# Patient Record
Sex: Male | Born: 1980
Health system: Southern US, Community
[De-identification: ages and names within clinical notes are randomized; demographics above are authoritative.]

## PROBLEM LIST (undated history)

## (undated) ENCOUNTER — Emergency Department (HOSPITAL_COMMUNITY): Admission: EM | Payer: Medicaid Other | Source: Home / Self Care

## (undated) DIAGNOSIS — A4902 Methicillin resistant Staphylococcus aureus infection, unspecified site: Secondary | ICD-10-CM

## (undated) HISTORY — PX: OTHER SURGICAL HISTORY: SHX169

---

## 2001-12-12 ENCOUNTER — Emergency Department (HOSPITAL_COMMUNITY): Admission: EM | Admit: 2001-12-12 | Discharge: 2001-12-12 | Payer: Self-pay | Admitting: Emergency Medicine

## 2019-09-13 ENCOUNTER — Emergency Department (HOSPITAL_COMMUNITY)
Admission: EM | Admit: 2019-09-13 | Discharge: 2019-09-14 | Disposition: A | Discharge status: Home / Self Care | Payer: Medicaid Other | Attending: Emergency Medicine | Admitting: Emergency Medicine

## 2019-09-13 DIAGNOSIS — M545 Low back pain, unspecified: Secondary | ICD-10-CM

## 2019-09-13 DIAGNOSIS — F17210 Nicotine dependence, cigarettes, uncomplicated: Secondary | ICD-10-CM | POA: Insufficient documentation

## 2019-09-13 DIAGNOSIS — M549 Dorsalgia, unspecified: Secondary | ICD-10-CM | POA: Diagnosis present

## 2019-09-13 DIAGNOSIS — G8929 Other chronic pain: Secondary | ICD-10-CM | POA: Insufficient documentation

## 2019-09-14 ENCOUNTER — Emergency Department (HOSPITAL_COMMUNITY)
Admission: EM | Admit: 2019-09-14 | Discharge: 2019-09-14 | Disposition: A | Discharge status: Home / Self Care | Payer: Medicaid Other | Source: Home / Self Care | Attending: Emergency Medicine | Admitting: Emergency Medicine

## 2019-09-14 ENCOUNTER — Encounter (HOSPITAL_COMMUNITY): Payer: Self-pay

## 2019-09-14 ENCOUNTER — Encounter (HOSPITAL_COMMUNITY): Payer: Self-pay | Admitting: Emergency Medicine

## 2019-09-14 ENCOUNTER — Emergency Department (HOSPITAL_COMMUNITY): Payer: Medicaid Other

## 2019-09-14 ENCOUNTER — Other Ambulatory Visit: Payer: Self-pay

## 2019-09-14 DIAGNOSIS — L03211 Cellulitis of face: Secondary | ICD-10-CM | POA: Insufficient documentation

## 2019-09-14 DIAGNOSIS — F17210 Nicotine dependence, cigarettes, uncomplicated: Secondary | ICD-10-CM | POA: Insufficient documentation

## 2019-09-14 DIAGNOSIS — Z79899 Other long term (current) drug therapy: Secondary | ICD-10-CM | POA: Insufficient documentation

## 2019-09-14 LAB — CBC WITH DIFFERENTIAL/PLATELET
Abs Immature Granulocytes: 0.03 10*3/uL (ref 0.00–0.07)
Basophils Absolute: 0 10*3/uL (ref 0.0–0.1)
Basophils Relative: 0 %
Eosinophils Absolute: 0.3 10*3/uL (ref 0.0–0.5)
Eosinophils Relative: 3 %
HCT: 42.7 % (ref 39.0–52.0)
Hemoglobin: 13 g/dL (ref 13.0–17.0)
Immature Granulocytes: 0 %
Lymphocytes Relative: 10 %
Lymphs Abs: 1.1 10*3/uL (ref 0.7–4.0)
MCH: 25.9 pg — ABNORMAL LOW (ref 26.0–34.0)
MCHC: 30.4 g/dL (ref 30.0–36.0)
MCV: 85.2 fL (ref 80.0–100.0)
Monocytes Absolute: 0.9 10*3/uL (ref 0.1–1.0)
Monocytes Relative: 8 %
Neutro Abs: 8.8 10*3/uL — ABNORMAL HIGH (ref 1.7–7.7)
Neutrophils Relative %: 79 %
Platelets: 299 10*3/uL (ref 150–400)
RBC: 5.01 MIL/uL (ref 4.22–5.81)
RDW: 16.1 % — ABNORMAL HIGH (ref 11.5–15.5)
WBC: 11.1 10*3/uL — ABNORMAL HIGH (ref 4.0–10.5)
nRBC: 0 % (ref 0.0–0.2)

## 2019-09-14 LAB — C-REACTIVE PROTEIN: CRP: 1.4 mg/dL — ABNORMAL HIGH (ref <1.0)

## 2019-09-14 LAB — BASIC METABOLIC PANEL
Anion gap: 8 (ref 5–15)
BUN: 11 mg/dL (ref 6–20)
CO2: 27 mmol/L (ref 22–32)
Calcium: 9.2 mg/dL (ref 8.9–10.3)
Chloride: 102 mmol/L (ref 98–111)
Creatinine, Ser: 1.17 mg/dL (ref 0.61–1.24)
GFR calc Af Amer: >60 mL/min (ref >60)
GFR calc non Af Amer: >60 mL/min (ref >60)
Glucose, Bld: 91 mg/dL (ref 70–99)
Potassium: 3.9 mmol/L (ref 3.5–5.1)
Sodium: 137 mmol/L (ref 135–145)

## 2019-09-14 LAB — SEDIMENTATION RATE: Sed Rate: 12 mm/hr (ref 0–16)

## 2019-09-14 MED ORDER — DOXYCYCLINE HYCLATE 100 MG PO CAPS: 100.0000 mg | ORAL_CAPSULE | Freq: Two times a day (BID) | ORAL | 0 refills | Status: DC

## 2019-09-14 MED ORDER — SODIUM CHLORIDE (PF) 0.9 % IJ SOLN
INTRAMUSCULAR | Status: AC
  Filled 2019-09-14: qty 50

## 2019-09-14 MED ORDER — DOXYCYCLINE HYCLATE 100 MG PO TABS
100.0000 mg | ORAL_TABLET | Freq: Once | ORAL | Status: AC
  Administered 2019-09-14: 100 mg via ORAL
  Filled 2019-09-14: qty 1

## 2019-09-14 MED ORDER — METHOCARBAMOL 500 MG PO TABS: 500.0000 mg | ORAL_TABLET | Freq: Two times a day (BID) | ORAL | 0 refills | Status: AC

## 2019-09-14 MED ORDER — LIDOCAINE 5 % EX PTCH: 1.0000 | MEDICATED_PATCH | CUTANEOUS | 0 refills | Status: AC

## 2019-09-14 MED ORDER — VANCOMYCIN HCL IN DEXTROSE 1-5 GM/200ML-% IV SOLN
1000.0000 mg | Freq: Once | INTRAVENOUS | Status: AC
  Administered 2019-09-14: 1000 mg via INTRAVENOUS
  Filled 2019-09-14: qty 200

## 2019-09-14 MED ORDER — IOHEXOL 300 MG/ML  SOLN
75.0000 mL | Freq: Once | INTRAMUSCULAR | Status: AC | PRN
  Administered 2019-09-14: 75 mL via INTRAVENOUS

## 2019-09-14 MED FILL — DOXYCYCLINE HYC 100 MG CAPS: 100 | 10 days supply | Qty: 20 | Fill #0

## 2019-09-14 NOTE — ED Notes (Signed)
Pt provided soda and sandwich.

## 2019-09-14 NOTE — ED Notes (Signed)
Pt very groggy, unable to tell RN what he took.  Pt rolled over and went back to sleep.

## 2019-09-14 NOTE — Discharge Instructions (Signed)
We saw in the ER for facial swelling. Results revealed that you have facial cellulitis without any abscess.  Please take doxycycline. Follow-up with your infectious disease doctors if you are not getting better.  Return to the ER if you start having severe headaches, fevers, nausea, vomiting, vision change.

## 2019-09-14 NOTE — ED Notes (Signed)
ED Provider at bedside. 

## 2019-09-14 NOTE — Progress Notes (Signed)
TOC CM received referral for medication assistance. Pt has Medicaid which covers his meds for a $3 copay. [place] and they will assist him with copay cost. Attempted to contact pt but no voicemail. [place] [place] [facility] TOC CM [phone]

## 2019-09-14 NOTE — ED Provider Notes (Signed)
[facility] EMERGENCY DEPARTMENT Provider Note   CSN: [id number] Arrival date & time: 09/13/19  2352     History   Chief Complaint Chief Complaint  Patient presents with   Back Pain    HPI Manuel Gibbs is a 38 y.o. male.     HPI   Manuel Gibbs is a 38 y.o. male, with a history of back pain, presenting to the ED with back pain starting afternoon of Oct 11. Pain is left lower back, cramping, intermittent, moderate to severe, nonradiating.  He states he has had this back pain "flareup" before. He has taken Tylenol for his pain.  He states he has never been assessed for this pain. Denies fever/chills, abdominal pain, syncope, numbness, weakness, falls/trauma, urinary symptoms, or any other complaints. States he does have a PCP, but does not remember their name.    History reviewed. No pertinent past medical history.  There are no active problems to display for this patient.   History reviewed. No pertinent surgical history.      Home Medications    Prior to Admission medications   Medication Sig Start Date End Date Taking? Authorizing Provider  doxycycline (VIBRAMYCIN) 100 MG capsule Take 1 capsule (100 mg total) by mouth 2 (two) times daily. 09/14/19   Varney Biles, MD  ibuprofen (ADVIL) 200 MG tablet Take 200 mg by mouth every 6 (six) hours as needed for moderate pain.    [provider]  lidocaine (LIDODERM) 5 % Place 1 patch onto the skin daily. Remove & Discard patch within 12 hours or as directed by MD 09/14/19   Armour Villanueva C, PA-C  methocarbamol (ROBAXIN) 500 MG tablet Take 1 tablet (500 mg total) by mouth 2 (two) times daily. 09/14/19   Maninder Deboer, Helane Gunther, PA-C    Family History No family history on file.  Social History Social History   Tobacco Use   Smoking status: Current Every Day Smoker  Substance Use Topics   Alcohol use: Not on file   Drug use: Not on file     Allergies   Patient has no known  allergies.   Review of Systems Review of Systems  Constitutional: Negative for chills and fever.  Respiratory: Negative for cough and shortness of breath.   Cardiovascular: Negative for chest pain.  Gastrointestinal: Negative for abdominal pain, diarrhea, nausea and vomiting.  Genitourinary: Negative for difficulty urinating, dysuria, flank pain, frequency, hematuria and testicular pain.  Musculoskeletal: Positive for back pain.  Neurological: Negative for dizziness, syncope, weakness, light-headedness and numbness.  All other systems reviewed and are negative.    Physical Exam Updated Vital Signs BP (!) 155/98 (BP Location: Right Arm)    Pulse 81    Temp 97.7 F (36.5 C) (Oral)    Resp 17    SpO2 100%   Physical Exam Vitals signs and nursing note reviewed.  Constitutional:      General: He is not in acute distress.    Appearance: He is well-developed. He is not diaphoretic.     Comments: Patient initially sleeping when I walked in to conduct the interview.  HENT:     Head: Normocephalic and atraumatic.     Mouth/Throat:     Mouth: Mucous membranes are moist.     Pharynx: Oropharynx is clear.  Eyes:     Conjunctiva/sclera: Conjunctivae normal.  Neck:     Musculoskeletal: Neck supple.  Cardiovascular:     Rate and Rhythm: Normal rate and regular rhythm.  Pulses: Normal pulses.          Radial pulses are 2+ on the right side and 2+ on the left side.       Posterior tibial pulses are 2+ on the right side and 2+ on the left side.     Heart sounds: Normal heart sounds.     Comments: Tactile temperature in the extremities appropriate and equal bilaterally. Pulmonary:     Effort: Pulmonary effort is normal. No respiratory distress.     Breath sounds: Normal breath sounds.  Abdominal:     Palpations: Abdomen is soft.     Tenderness: There is no abdominal tenderness. There is no guarding.  Musculoskeletal:       Back:     Right lower leg: No edema.     Left lower leg:  No edema.     Comments: Normal motor function intact in all extremities. No midline spinal tenderness.   Lymphadenopathy:     Cervical: No cervical adenopathy.  Skin:    General: Skin is warm and dry.  Neurological:     Mental Status: He is alert.     Comments: Sensation grossly intact to light touch in the lower extremities bilaterally. No saddle anesthesias. Strength 5/5 in the bilateral lower extremities. No noted gait deficit. Coordination intact with heel to shin testing.  Psychiatric:        Mood and Affect: Mood and affect normal.        Speech: Speech normal.        Behavior: Behavior normal.      ED Treatments / Results  Labs (all labs ordered are listed, but only abnormal results are displayed) Labs Reviewed - No data to display  EKG None  Radiology Ct Maxillofacial W Contrast  Result Date: 09/14/2019 CLINICAL DATA:  Mass, lump or swelling of the chin and around the eyes. History of previous abscess. Evaluate for recurrent abscess. EXAM: CT MAXILLOFACIAL WITH CONTRAST TECHNIQUE: Multidetector CT imaging of the maxillofacial structures was performed with intravenous contrast. Multiplanar CT image reconstructions were also generated. CONTRAST:  47mL OMNIPAQUE IOHEXOL 300 MG/ML  SOLN COMPARISON:  None. FINDINGS: Osseous: Normal. No traumatic finding. No evidence of dental or periodontal disease resulting in acute inflammation. Orbits: Normal postseptal orbits. Nonspecific soft tissue swelling of the periorbital superficial soft tissues which could be cellulitis. No evidence of abscess. Sinuses: Clear and normal. Soft tissues: Also nonspecific edema pattern within the superficial soft tissues of the chin region. No evidence of drainable abscess. No evidence of regional low-density adenopathy. Limited intracranial: Normal IMPRESSION: Superficial edema in the periorbital soft tissues and in the region of the chin consistent with superficial cellulitis. No evidence of drainable  abscess or deep space infection. Electronically Signed   By: Paulina Fusi M.D.   On: 09/14/2019 13:56    Procedures Procedures (including critical care time)  Medications Ordered in ED Medications - No data to display   Initial Impression / Assessment and Plan / ED Course  I have reviewed the triage vital signs and the nursing notes.  Pertinent labs & imaging results that were available during my care of the patient were reviewed by me and considered in my medical decision making (see chart for details).        Patient presents with what he describes as acute on chronic left lower back pain. No focal neuro deficits.  No red flag symptoms.  I discussed with the patient the possibility of alternative diagnoses, such as kidney stone,  however, he was not interested in exploring these.  I advised him to follow-up with his PCP. The patient was given instructions for home care as well as return precautions. Patient voices understanding of these instructions, accepts the plan, and is comfortable with discharge.   Final Clinical Impressions(s) / ED Diagnoses   Final diagnoses:  Chronic left-sided low back pain without sciatica    ED Discharge Orders         Ordered    methocarbamol (ROBAXIN) 500 MG tablet  2 times daily     09/14/19 0520    lidocaine (LIDODERM) 5 %  Every 24 hours     09/14/19 0520           Anselm PancoastJoy, Ramani Riva C, PA-C 09/15/19 0433    Dione BoozeGlick, David, MD 09/15/19 1015

## 2019-09-14 NOTE — Discharge Instructions (Addendum)
°  Take it easy, but do not lay around too much as this may make any stiffness worse.  °Antiinflammatory medications: Take 600 mg of ibuprofen every 6 hours or 440 mg (over the counter dose) to 500 mg (prescription dose) of naproxen every 12 hours for the next 3 days. After this time, these medications may be used as needed for pain. Take these medications with food to avoid upset stomach. Choose only one of these medications, do not take them together. °Acetaminophen (generic for Tylenol): Should you continue to have additional pain while taking the ibuprofen or naproxen, you may add in acetaminophen as needed. Your daily total maximum amount of acetaminophen from all sources should be limited to 4000mg/day for persons without liver problems, or 2000mg/day for those with liver problems. °Methocarbamol: Methocarbamol (generic for Robaxin) is a muscle relaxer and can help relieve stiff muscles or muscle spasms.  Do not drive or perform other dangerous activities while taking this medication as it can cause drowsiness as well as changes in reaction time and judgement. °Lidocaine patches: These are available via either prescription or over-the-counter. The over-the-counter option may be more economical one and are likely just as effective. There are multiple over-the-counter brands, such as Salonpas. °Exercises: Be sure to perform the attached exercises starting with three times a week and working up to performing them daily. This is an essential part of preventing long term problems.  °Follow up: Follow up with a primary care provider for any future management of these complaints. Be sure to follow up within 7-10 days. °Return: Return to the ED should symptoms worsen. ° °For prescription assistance, may try using prescription discount sites or apps, such as goodrx.com °

## 2019-09-14 NOTE — ED Notes (Signed)
Pt.had a phone call had his phone on speaker who ever on the other end ask have you gotten the pain meds the patient.came in all bend over stating he need a wheelchair,pt.was on the phone walking out on the lobby ,walking out without no problem .walking and smoking in front of the ED DOORS

## 2019-09-14 NOTE — ED Triage Notes (Signed)
Patient states that he has chronic back pain and has been in pain for one hour.
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
## 2019-09-14 NOTE — ED Triage Notes (Addendum)
Pt presents with c/o rash on his face. 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 Pt does have significant rash to the bottom of his face. Pt also c/o back pain, chronic problem. Pt reports he needs an IV antibiotic.

## 2019-09-16 LAB — AEROBIC CULTURE W GRAM STAIN (SUPERFICIAL SPECIMEN)
Gram Stain: NONE SEEN
Special Requests: NORMAL

## 2019-09-17 ENCOUNTER — Telehealth: Payer: Self-pay | Admitting: *Deleted

## 2019-09-17 NOTE — Telephone Encounter (Signed)
Post ED Visit - Positive Culture Follow-up  Culture report reviewed by antimicrobial stewardship pharmacist: [facility] Team []  Elenor Quinones, Pharm.D. []  Heide Guile, Pharm.D., BCPS AQ-ID []  Parks Neptune, Pharm.D., BCPS []  Alycia Rossetti, Pharm.D., BCPS []  [place], [place].D., BCPS, AAHIVP []  Legrand Como, Pharm.D., BCPS, AAHIVP []  Salome Arnt, PharmD, BCPS []  Johnnette Gourd, PharmD, BCPS []  Hughes Better, PharmD, BCPS []  Leeroy Cha, PharmD []  Laqueta Linden, PharmD, BCPS []  Albertina Parr, PharmD  [place] Team []  Leodis Sias, PharmD []  Lindell Spar, PharmD []  Royetta Asal, PharmD []  Graylin Shiver, Rph []  Rema Fendt) Glennon Mac, PharmD []  Arlyn Dunning, PharmD []  Netta Cedars, PharmD []  Dia Sitter, PharmD []  Leone Haven, PharmD []  Gretta Arab, PharmD []  Theodis Shove, PharmD []  Peggyann Juba, PharmD [x]  Reuel Boom, PharmD   Positive wound culture Treated with Doxycycline Hyclate, organism sensitive to the same and no further patient follow-up is required at this time.  Harlon Flor [place] 09/17/2019, 11:30 AM

## 2019-09-27 ENCOUNTER — Encounter (HOSPITAL_COMMUNITY): Payer: Self-pay | Admitting: Emergency Medicine

## 2019-09-27 ENCOUNTER — Other Ambulatory Visit: Payer: Self-pay

## 2019-09-27 DIAGNOSIS — Y929 Unspecified place or not applicable: Secondary | ICD-10-CM | POA: Diagnosis not present

## 2019-09-27 DIAGNOSIS — F17200 Nicotine dependence, unspecified, uncomplicated: Secondary | ICD-10-CM | POA: Diagnosis not present

## 2019-09-27 DIAGNOSIS — Y9389 Activity, other specified: Secondary | ICD-10-CM | POA: Insufficient documentation

## 2019-09-27 DIAGNOSIS — S0036XA Insect bite (nonvenomous) of nose, initial encounter: Secondary | ICD-10-CM | POA: Insufficient documentation

## 2019-09-27 DIAGNOSIS — W57XXXA Bitten or stung by nonvenomous insect and other nonvenomous arthropods, initial encounter: Secondary | ICD-10-CM | POA: Insufficient documentation

## 2019-09-27 DIAGNOSIS — Y998 Other external cause status: Secondary | ICD-10-CM | POA: Diagnosis not present

## 2019-09-27 NOTE — ED Triage Notes (Signed)
Patient reports he was stung by wasp on nose yesterday. Swelling to bilateral eyes. Reports taking benadryl today. Denies SOB and throat swelling. Speaking in full sentences without difficulty.

## 2019-09-28 ENCOUNTER — Emergency Department (HOSPITAL_COMMUNITY)
Admission: EM | Admit: 2019-09-28 | Discharge: 2019-09-28 | Disposition: A | Discharge status: Home / Self Care | Payer: Medicaid Other | Attending: Emergency Medicine | Admitting: Emergency Medicine

## 2019-09-28 DIAGNOSIS — S0036XA Insect bite (nonvenomous) of nose, initial encounter: Secondary | ICD-10-CM

## 2019-09-28 MED ORDER — FAMOTIDINE 20 MG PO TABS: 20.0000 mg | ORAL_TABLET | Freq: Two times a day (BID) | ORAL | 0 refills | Status: AC

## 2019-09-28 MED ORDER — FAMOTIDINE 20 MG PO TABS
20.0000 mg | ORAL_TABLET | Freq: Once | ORAL | Status: AC
  Administered 2019-09-28: 04:00:00 20 mg via ORAL
  Filled 2019-09-28: qty 1

## 2019-09-28 MED ORDER — PREDNISONE 20 MG PO TABS: ORAL_TABLET | ORAL | 0 refills | Status: AC

## 2019-09-28 MED ORDER — PREDNISONE 20 MG PO TABS
60.0000 mg | ORAL_TABLET | Freq: Once | ORAL | Status: AC
  Administered 2019-09-28: 60 mg via ORAL
  Filled 2019-09-28: qty 3

## 2019-09-28 MED FILL — predniSONE 20 MG TABS: 20 | 9 days supply | Qty: 18 | Fill #0

## 2019-09-28 MED FILL — FAMOTIDINE 20 MG TABS: 20 | 10 days supply | Qty: 20 | Fill #0

## 2019-09-28 NOTE — ED Notes (Signed)
ED Provider at bedside. 

## 2019-09-28 NOTE — Discharge Instructions (Signed)
Try to keep your head elevated.  Use ice packs over the swollen areas, heat will make the swelling worse.  Take the prednisone and Pepcid as prescribed and until gone.  You can take Zyrtec or Claritin once a day over-the-counter and use the Benadryl at nighttime because it will make you sleepy.  Recheck if you get a fever, pain in your eyes, or if it is not improving over the next couple days.

## 2019-09-28 NOTE — ED Provider Notes (Signed)
[facility]-EMERGENCY DEPT Provider Note   CSN: [id number] Arrival date & time: 09/27/19  2129   Time seen 3:50 AM  History   Chief Complaint Chief Complaint  Patient presents with  . Insect Bite    HPI Manuel Gibbs is a 38 y.o. male.     HPI patient states he was stung by a wasp between his eyebrows on the 25th.  He denies itching.  He states it was swelling by the next day.  He has been taking Benadryl 50 mg every 4-6 hours.  He reports the swelling has extended to his eyes.  He denies any difficulty swallowing or breathing.  He states he was stung by multiple bees as a child and had a allergic reaction to that.  He did not have to do an EpiPen.  PCP Patient, No Pcp Per   History reviewed. No pertinent past medical history.  There are no active problems to display for this patient.   History reviewed. No pertinent surgical history.      Home Medications    Prior to Admission medications   Medication Sig Start Date End Date Taking? Authorizing Provider  doxycycline (VIBRAMYCIN) 100 MG capsule Take 1 capsule (100 mg total) by mouth 2 (two) times daily. 09/14/19   Derwood Kaplan, MD  famotidine (PEPCID) 20 MG tablet Take 1 tablet (20 mg total) by mouth 2 (two) times daily. 09/28/19   Devoria Albe, MD  ibuprofen (ADVIL) 200 MG tablet Take 200 mg by mouth every 6 (six) hours as needed for moderate pain.    [provider]  lidocaine (LIDODERM) 5 % Place 1 patch onto the skin daily. Remove & Discard patch within 12 hours or as directed by MD 09/14/19   Joy, Shawn C, PA-C  methocarbamol (ROBAXIN) 500 MG tablet Take 1 tablet (500 mg total) by mouth 2 (two) times daily. 09/14/19   Joy, Shawn C, PA-C  predniSONE (DELTASONE) 20 MG tablet Take 3 po QD x 3d , then 2 po QD x 3d then 1 po QD x 3d 09/28/19   Devoria Albe, MD    Family History No family history on file.  Social History Social History   Tobacco Use  . Smoking status: Current Every Day  Smoker  Substance Use Topics  . Alcohol use: Not on file  . Drug use: Not on file     Allergies   Patient has no known allergies.   Review of Systems Review of Systems  All other systems reviewed and are negative.    Physical Exam Updated Vital Signs BP (!) 142/93 (BP Location: Right Arm)   Pulse 85   Temp 98.1 F (36.7 C)   Resp 14   SpO2 98%   Physical Exam Vitals signs and nursing note reviewed.  Constitutional:      Comments: Sleeping but easily awakened  HENT:     Head: Normocephalic.     Comments: Patient is noted to have swelling in the space between his eyebrows and his nose.  There is no intense redness or erythema to the skin to suggest cellulitis.  He moves his eyes easily.    Right Ear: External ear normal.     Left Ear: External ear normal.  Eyes:     Extraocular Movements: Extraocular movements intact.     Conjunctiva/sclera: Conjunctivae normal.     Pupils: Pupils are equal, round, and reactive to light.     Comments: Patient has swelling of both his upper and  lower eyelids bilaterally.  The globe of his eyes are easily seen.  Neck:     Musculoskeletal: Normal range of motion.  Cardiovascular:     Rate and Rhythm: Normal rate.  Pulmonary:     Effort: Pulmonary effort is normal. No respiratory distress.  Musculoskeletal: Normal range of motion.  Skin:    General: Skin is warm and dry.     Findings: No erythema.  Neurological:     General: No focal deficit present.     Mental Status: He is oriented to person, place, and time.     Cranial Nerves: No cranial nerve deficit.  Psychiatric:        Mood and Affect: Mood normal.        Behavior: Behavior normal.        Thought Content: Thought content normal.      ED Treatments / Results  Labs (all labs ordered are listed, but only abnormal results are displayed) Labs Reviewed - No data to display  EKG None  Radiology No results found.  Procedures Procedures (including critical care time)   Medications Ordered in ED Medications  famotidine (PEPCID) tablet 20 mg (has no administration in time range)  predniSONE (DELTASONE) tablet 60 mg (has no administration in time range)     Initial Impression / Assessment and Plan / ED Course  I have reviewed the triage vital signs and the nursing notes.  Pertinent labs & imaging results that were available during my care of the patient were reviewed by me and considered in my medical decision making (see chart for details).       Patient was given oral prednisone and Pepcid.  He was sent home with the same.  We discussed using ice packs and to avoid heat, heat will make the swelling worse.  He should be rechecked if he gets fever or if he is not improving over the next 48 hours.  Final Clinical Impressions(s) / ED Diagnoses   Final diagnoses:  Insect bite of nose, initial encounter    ED Discharge Orders         Ordered    predniSONE (DELTASONE) 20 MG tablet     09/28/19 0406    famotidine (PEPCID) 20 MG tablet  2 times daily     09/28/19 0406          Plan discharge  Rolland Porter, MD, Barbette Or, MD 09/28/19 [phone]

## 2019-11-04 ENCOUNTER — Encounter (HOSPITAL_COMMUNITY): Payer: Self-pay

## 2019-11-04 ENCOUNTER — Other Ambulatory Visit: Payer: Self-pay

## 2019-11-04 DIAGNOSIS — Z20828 Contact with and (suspected) exposure to other viral communicable diseases: Secondary | ICD-10-CM | POA: Diagnosis not present

## 2019-11-04 DIAGNOSIS — Z8614 Personal history of Methicillin resistant Staphylococcus aureus infection: Secondary | ICD-10-CM | POA: Diagnosis not present

## 2019-11-04 DIAGNOSIS — M71062 Abscess of bursa, left knee: Secondary | ICD-10-CM | POA: Diagnosis not present

## 2019-11-04 DIAGNOSIS — L0211 Cutaneous abscess of neck: Secondary | ICD-10-CM | POA: Diagnosis present

## 2019-11-04 DIAGNOSIS — F17210 Nicotine dependence, cigarettes, uncomplicated: Secondary | ICD-10-CM | POA: Diagnosis not present

## 2019-11-04 DIAGNOSIS — Z79899 Other long term (current) drug therapy: Secondary | ICD-10-CM | POA: Diagnosis not present

## 2019-11-04 NOTE — ED Triage Notes (Signed)
Pt c/o left knee and neck abscess. Pt states they appeared several days ago, pt is unable to bend left knee. Both areas are reddened, swollen and hot to the touch.

## 2019-11-05 ENCOUNTER — Other Ambulatory Visit: Payer: Self-pay

## 2019-11-05 ENCOUNTER — Emergency Department (HOSPITAL_COMMUNITY)
Admission: EM | Admit: 2019-11-05 | Discharge: 2019-11-05 | Disposition: A | Discharge status: Home / Self Care | Payer: Medicaid Other | Attending: Emergency Medicine | Admitting: Emergency Medicine

## 2019-11-05 ENCOUNTER — Emergency Department (HOSPITAL_COMMUNITY): Payer: Medicaid Other

## 2019-11-05 ENCOUNTER — Emergency Department (HOSPITAL_COMMUNITY)
Admission: EM | Admit: 2019-11-05 | Discharge: 2019-11-06 | Disposition: A | Discharge status: Home / Self Care | Payer: Medicaid Other | Source: Home / Self Care | Attending: Emergency Medicine | Admitting: Emergency Medicine

## 2019-11-05 ENCOUNTER — Encounter (HOSPITAL_COMMUNITY): Payer: Self-pay

## 2019-11-05 DIAGNOSIS — L0211 Cutaneous abscess of neck: Secondary | ICD-10-CM | POA: Insufficient documentation

## 2019-11-05 DIAGNOSIS — Z20828 Contact with and (suspected) exposure to other viral communicable diseases: Secondary | ICD-10-CM | POA: Insufficient documentation

## 2019-11-05 DIAGNOSIS — F17210 Nicotine dependence, cigarettes, uncomplicated: Secondary | ICD-10-CM | POA: Insufficient documentation

## 2019-11-05 DIAGNOSIS — Z79899 Other long term (current) drug therapy: Secondary | ICD-10-CM | POA: Insufficient documentation

## 2019-11-05 DIAGNOSIS — L02416 Cutaneous abscess of left lower limb: Secondary | ICD-10-CM | POA: Insufficient documentation

## 2019-11-05 MED ORDER — LIDOCAINE-EPINEPHRINE (PF) 2 %-1:200000 IJ SOLN
10.0000 mL | Freq: Once | INTRAMUSCULAR | Status: AC
  Administered 2019-11-05: 10 mL
  Filled 2019-11-05: qty 10

## 2019-11-05 MED ORDER — MUPIROCIN CALCIUM 2 % NA OINT: TOPICAL_OINTMENT | NASAL | 0 refills | Status: AC

## 2019-11-05 MED ORDER — DOXYCYCLINE HYCLATE 100 MG PO TABS
100.0000 mg | ORAL_TABLET | Freq: Once | ORAL | Status: AC
  Administered 2019-11-05: 100 mg via ORAL
  Filled 2019-11-05: qty 1

## 2019-11-05 MED ORDER — DOXYCYCLINE HYCLATE 100 MG PO CAPS: 100.0000 mg | ORAL_CAPSULE | Freq: Two times a day (BID) | ORAL | 0 refills | Status: DC

## 2019-11-05 MED ORDER — IBUPROFEN 800 MG PO TABS
800.0000 mg | ORAL_TABLET | Freq: Once | ORAL | Status: AC
  Administered 2019-11-05: 02:00:00 800 mg via ORAL
  Filled 2019-11-05: qty 1

## 2019-11-05 MED ORDER — DOXYCYCLINE HYCLATE 100 MG PO TABS
100.0000 mg | ORAL_TABLET | Freq: Once | ORAL | Status: AC
  Administered 2019-11-06: 100 mg via ORAL
  Filled 2019-11-05: qty 1

## 2019-11-05 MED ORDER — LIDOCAINE-EPINEPHRINE (PF) 2 %-1:200000 IJ SOLN
10.0000 mL | Freq: Once | INTRAMUSCULAR | Status: DC
  Filled 2019-11-05: qty 10

## 2019-11-05 MED ORDER — CLINDAMYCIN HCL 300 MG PO CAPS
300.0000 mg | ORAL_CAPSULE | Freq: Once | ORAL | Status: AC
  Administered 2019-11-05: 300 mg via ORAL
  Filled 2019-11-05: qty 1

## 2019-11-05 NOTE — ED Notes (Signed)
PT states he was here yesterday for treatment of abscess on his lateral right side of neck and left knee. He was given a prescription for clindamycin (as per patient) but pt states that historically that medication has not worked for him. Pt states he tried to make provider aware of this yesterday. Pt states he is still experiencing pain and discomfort in his neck due to the abscess. Pt states he also made provider aware yesterday of contact with COVID + friend as well as his ex girlfriend and son who recently tested positive, but had not been showing symptoms.

## 2019-11-05 NOTE — ED Notes (Signed)
Pt was verbalized discharge instructions. Pt had no further questions at this time. NAD. 

## 2019-11-05 NOTE — ED Provider Notes (Signed)
[facility] DEPT Provider Note   CSN: [id number] Arrival date & time: 11/04/19  2231     History   Chief Complaint Chief Complaint  Patient presents with  . Abscess    HPI Ronelle Smallman is a 38 y.o. male.     38 year old male presents to the emergency department for evaluation of abscess to his neck and left knee.  Symptoms have been present over the past 3 days.  He reports worsening swelling and redness.  No active drainage.  Notes some discomfort in his left knee with flexion due to overlying abscess.  Has not had any fevers.  Denies taking any medications for pain.  Has a history of recurrent MRSA infection.  Denies IVDU.  The history is provided by the patient. No language interpreter was used.  Abscess   No past medical history on file.  There are no active problems to display for this patient.   No past surgical history on file.      Home Medications    Prior to Admission medications   Medication Sig Start Date End Date Taking? Authorizing Provider  ibuprofen (ADVIL) 200 MG tablet Take 200 mg by mouth every 6 (six) hours as needed for moderate pain.   Yes [provider]  doxycycline (VIBRAMYCIN) 100 MG capsule Take 1 capsule (100 mg total) by mouth 2 (two) times daily. 11/05/19   Antonietta Breach, PA-C  famotidine (PEPCID) 20 MG tablet Take 1 tablet (20 mg total) by mouth 2 (two) times daily. Patient not taking: Reported on 11/05/2019 09/28/19   Rolland Porter, MD  lidocaine (LIDODERM) 5 % Place 1 patch onto the skin daily. Remove & Discard patch within 12 hours or as directed by MD 09/14/19   Joy, Shawn C, PA-C  methocarbamol (ROBAXIN) 500 MG tablet Take 1 tablet (500 mg total) by mouth 2 (two) times daily. 09/14/19   Joy, Shawn C, PA-C  predniSONE (DELTASONE) 20 MG tablet Take 3 po QD x 3d , then 2 po QD x 3d then 1 po QD x 3d Patient not taking: Reported on 11/05/2019 09/28/19   Rolland Porter, MD    Family History No family history  on file.  Social History Social History   Tobacco Use  . Smoking status: Current Every Day Smoker  . Smokeless tobacco: Never Used  Substance Use Topics  . Alcohol use: Yes    Alcohol/week: 2.0 standard drinks    Types: 2 Cans of beer per week    Comment: daily  . Drug use: Never     Allergies   Patient has no known allergies.   Review of Systems Review of Systems Ten systems reviewed and are negative for acute change, except as noted in the HPI.    Physical Exam Updated Vital Signs BP 119/71 (BP Location: Right Arm)   Pulse 94   Temp 98.7 F (37.1 C) (Oral)   Resp 14   SpO2 100%   Physical Exam Vitals signs and nursing note reviewed.  Constitutional:      General: He is not in acute distress.    Appearance: He is well-developed. He is not diaphoretic.     Comments: Nontoxic appearing and in NAD. Sleeping when not stimulated.  HENT:     Head: Normocephalic and atraumatic.  Eyes:     General: No scleral icterus.    Conjunctiva/sclera: Conjunctivae normal.  Neck:     Musculoskeletal: Normal range of motion.     Comments: Pustule to right  posterior neck with associated surrounding induration. No fluctuance or linear streaking. No meningismus. Pulmonary:     Effort: Pulmonary effort is normal. No respiratory distress.     Comments: Respirations even and unlabored Musculoskeletal: Normal range of motion.     Comments: Soft tissue erythema and induration overlying the left lateral knee; 5cm diameter. No fluctuance. Preserved AROM and PROM of LLE and L knee.  Skin:    General: Skin is warm and dry.     Coloration: Skin is not pale.     Findings: No rash.  Neurological:     General: No focal deficit present.     Mental Status: He is oriented to person, place, and time.     Coordination: Coordination normal.     Comments: GCS 15. Speech clear, goal oriented.  Psychiatric:        Behavior: Behavior normal.      ED Treatments / Results  Labs (all labs  ordered are listed, but only abnormal results are displayed) Labs Reviewed - No data to display  EKG None  Radiology Dg Knee 2 Views Left  Result Date: 11/05/2019 CLINICAL DATA:  38 year old male with cellulitis, abscess. EXAM: LEFT KNEE - 1-2 VIEW COMPARISON:  None. FINDINGS: Bone mineralization is within normal limits. Joint spaces and alignment preserved. No osteolysis identified. Chronic ossific fragment at the tibial tuberosity. Questionable small joint effusion. Anterior soft tissue swelling mostly above the patella. No soft tissue gas. No radiopaque foreign body identified. IMPRESSION: Anterior soft tissue swelling with no soft tissue gas or radiographic evidence of osteomyelitis. Questionable small joint effusion. Electronically Signed   By: Odessa Fleming M.D.   On: 11/05/2019 01:59    Procedures Procedures (including critical care time)  EMERGENCY DEPARTMENT US SOFT TISSUE INTERPRETATION "Study: Limited Soft Tissue Ultrasound"  INDICATIONS: Pain and Soft tissue infection Multiple views of the body part were obtained in real-time with a multi-frequency linear probe PERFORMED BY:  Myself IMAGES ARCHIVED?: Yes SIDE:Right  BODY PART:Neck FINDINGS: Cellulitis present with minimal drainable fluid collection INTERPRETATION:  No evidence of drainable fluid collection   CPT: Neck 76536-26  Upper extremity 52841-32  Axilla 44010-27  Chest wall 25366-44  Beast 03474-25  Upper back 95638-75  Lower back 64332-95  Abdominal wall 18841-66  Pelvic wall 06301-60  Lower extremity 10932-35  Other soft tissue 57322-02   EMERGENCY DEPARTMENT US SOFT TISSUE INTERPRETATION "Study: Limited Soft Tissue Ultrasound"  INDICATIONS: Pain and Soft tissue infection Multiple views of the body part were obtained in real-time with a multi-frequency linear probe PERFORMED BY:  Myself IMAGES ARCHIVED?: Yes SIDE:Left BODY PART:Lower extremity FINDINGS: Cellulitis present INTERPRETATION:   Cellulitis present   CPT: Neck [id number]  Upper extremity 54270-62  Axilla 37628-31  Chest wall 51761-60  Beast 73710-62  Upper back 69485-46  Lower back 27035-00  Abdominal wall 93818-29  Pelvic wall 93716-96  Lower extremity 78938-10  Other soft tissue 17510-25    Medications Ordered in ED Medications  lidocaine-EPINEPHrine (XYLOCAINE W/EPI) 2 %-1:200000 (PF) injection 10 mL (has no administration in time range)  doxycycline (VIBRA-TABS) tablet 100 mg (has no administration in time range)  clindamycin (CLEOCIN) capsule 300 mg (300 mg Oral Given 11/05/19 0146)  ibuprofen (ADVIL) tablet 800 mg (800 mg Oral Given 11/05/19 0146)     Initial Impression / Assessment and Plan / ED Course  I have reviewed the triage vital signs and the nursing notes.  Pertinent labs & imaging results that were available during my care of the patient were  reviewed by me and considered in my medical decision making (see chart for details).        38 year old male presenting for recurrent soft tissue skin infection and abscess to the right posterior neck as well as the left knee x3 days.  History of recurrent MRSA treated previously with linezolid.  He does not appear to have any drainable fluid collection on ultrasound.  Expresses concern that infection in his left knee may have "gone to the bone", but imaging today is reassuring.  There is no subcutaneous gas.  His range of motion is largely preserved and he is afebrile.  Doubt septic joint at this time.  His prior MRSA culture was reviewed, resistant to both Bactrim and clindamycin.  MRSA does appear susceptible to doxycycline.  He has been placed back on a course of this antibiotic, instructed to follow-up with a primary care doctor.  Return precautions discussed and provided. Patient discharged in stable condition with no unaddressed concerns.   Final Clinical Impressions(s) / ED Diagnoses   Final diagnoses:  Abscess of neck  Abscess of  left leg    ED Discharge Orders         Ordered    doxycycline (VIBRAMYCIN) 100 MG capsule  2 times daily     11/05/19 0438           Antony MaduraHumes, Anda Sobotta, PA-C 11/05/19 0446    Dione BoozeGlick, David, MD 11/05/19 [phone]

## 2019-11-05 NOTE — ED Triage Notes (Addendum)
Pt coming from home c/o continued pain and swelling in neck abscess. Was seen yesterday and had it drained. Abscess on left knee has gotten better. Pt also has been in contact with somebody with "covid like symptoms" and wants test.

## 2019-11-05 NOTE — Discharge Instructions (Signed)
Take doxycycline as prescribed until finished.  Apply warm compresses to your abscesses 3-4 times per day to promote drainage.  You may take Tylenol or ibuprofen for management of pain.  Follow-up with a primary care doctor to ensure resolution of symptoms.

## 2019-11-05 NOTE — ED Notes (Signed)
Suture cart and I&D tray placed outside of pt room.

## 2019-11-06 LAB — SARS CORONAVIRUS 2 (TAT 6-24 HRS): SARS Coronavirus 2: NEGATIVE

## 2019-11-06 NOTE — ED Provider Notes (Signed)
[facility]-EMERGENCY DEPT Provider Note   CSN: [id number] Arrival date & time: 11/05/19  2122     History   Chief Complaint Chief Complaint  Patient presents with  . Abscess    HPI Manuel Gibbs is a 38 y.o. male.     The history is provided by the patient.  Abscess Location:  Head/neck Head/neck abscess location:  R neck Size:  2cmx2cm Abscess quality: draining, fluctuance, induration, painful and redness   Duration: multiple days. Progression:  Worsening Pain details:    Quality:  Sharp and shooting   Severity:  Moderate   Timing:  Constant   Progression:  Worsening Chronicity:  Recurrent Context: not injected drug use   Context comment:  Prior hx of abscess Relieved by:  Nothing Worsened by:  Nothing Ineffective treatments:  Oral antibiotics Associated symptoms: no fever, no headaches and no nausea   Risk factors: hx of MRSA and prior abscess     History reviewed. No pertinent past medical history.  There are no active problems to display for this patient.   History reviewed. No pertinent surgical history.      Home Medications    Prior to Admission medications   Medication Sig Start Date End Date Taking? Authorizing Provider  ibuprofen (ADVIL) 200 MG tablet Take 200 mg by mouth every 6 (six) hours as needed for moderate pain.   Yes [provider]  doxycycline (VIBRAMYCIN) 100 MG capsule Take 1 capsule (100 mg total) by mouth 2 (two) times daily. 11/05/19   Gwyneth SproutPlunkett, Owynn Mosqueda, MD  famotidine (PEPCID) 20 MG tablet Take 1 tablet (20 mg total) by mouth 2 (two) times daily. Patient not taking: Reported on 11/05/2019 09/28/19   Devoria AlbeKnapp, Iva, MD  lidocaine (LIDODERM) 5 % Place 1 patch onto the skin daily. Remove & Discard patch within 12 hours or as directed by MD 09/14/19   Joy, Shawn C, PA-C  methocarbamol (ROBAXIN) 500 MG tablet Take 1 tablet (500 mg total) by mouth 2 (two) times daily. 09/14/19   Joy, Ines BloomerShawn C, PA-C  mupirocin  nasal ointment (BACTROBAN) 2 % Apply in each nostril daily 11/05/19   Gwyneth SproutPlunkett, Cyris Maalouf, MD  predniSONE (DELTASONE) 20 MG tablet Take 3 po QD x 3d , then 2 po QD x 3d then 1 po QD x 3d Patient not taking: Reported on 11/05/2019 09/28/19   Devoria AlbeKnapp, Iva, MD    Family History No family history on file.  Social History Social History   Tobacco Use  . Smoking status: Current Every Day Smoker  . Smokeless tobacco: Never Used  Substance Use Topics  . Alcohol use: Yes    Alcohol/week: 2.0 standard drinks    Types: 2 Cans of beer per week    Comment: daily  . Drug use: Never     Allergies   Patient has no known allergies.   Review of Systems Review of Systems  Constitutional: Negative for fever.  Gastrointestinal: Negative for nausea.  Neurological: Negative for headaches.  All other systems reviewed and are negative.    Physical Exam Updated Vital Signs BP 133/68 (BP Location: Right Arm)   Pulse 88   Temp 98 F (36.7 C) (Oral)   Resp 16   SpO2 99%   Physical Exam Vitals signs and nursing note reviewed.  Constitutional:      General: He is not in acute distress.    Appearance: He is well-developed and normal weight.  HENT:     Head: Normocephalic and atraumatic.  Eyes:     Conjunctiva/sclera: Conjunctivae normal.     Pupils: Pupils are equal, round, and reactive to light.  Neck:     Musculoskeletal: Normal range of motion and neck supple.   Cardiovascular:     Rate and Rhythm: Normal rate and regular rhythm.     Heart sounds: No murmur.  Pulmonary:     Effort: Pulmonary effort is normal. No respiratory distress.     Breath sounds: Normal breath sounds. No wheezing or rales.  Abdominal:     General: There is no distension.     Palpations: Abdomen is soft.     Tenderness: There is no abdominal tenderness. There is no guarding or rebound.  Musculoskeletal: Normal range of motion.        General: No tenderness.     Left knee: He exhibits erythema.       Legs:   Skin:    General: Skin is warm and dry.     Findings: No erythema or rash.  Neurological:     General: No focal deficit present.     Mental Status: He is alert and oriented to person, place, and time.  Psychiatric:        Mood and Affect: Mood normal.        Behavior: Behavior normal.        Thought Content: Thought content normal.      ED Treatments / Results  Labs (all labs ordered are listed, but only abnormal results are displayed) Labs Reviewed  SARS CORONAVIRUS 2 (TAT 6-24 HRS)    EKG None  Radiology Dg Knee 2 Views Left  Result Date: 11/05/2019 CLINICAL DATA:  38 year old male with cellulitis, abscess. EXAM: LEFT KNEE - 1-2 VIEW COMPARISON:  None. FINDINGS: Bone mineralization is within normal limits. Joint spaces and alignment preserved. No osteolysis identified. Chronic ossific fragment at the tibial tuberosity. Questionable small joint effusion. Anterior soft tissue swelling mostly above the patella. No soft tissue gas. No radiopaque foreign body identified. IMPRESSION: Anterior soft tissue swelling with no soft tissue gas or radiographic evidence of osteomyelitis. Questionable small joint effusion. Electronically Signed   By: Odessa Fleming M.D.   On: 11/05/2019 01:59    Procedures Procedures (including critical care time) INCISION AND DRAINAGE Performed by: Gwyneth Sprout Consent: Verbal consent obtained. Risks and benefits: risks, benefits and alternatives were discussed Type: abscess  Body area: right neck  Anesthesia: local infiltration  Incision was made with a scalpel.  Local anesthetic: lidocaine 2% with epinephrine  Anesthetic total: 2 ml  Complexity: complex Blunt dissection to break up loculations  Drainage: purulent  Drainage amount: 52mL  Packing material: none Patient tolerance: Patient tolerated the procedure well with no immediate complications.     Medications Ordered in ED Medications  lidocaine-EPINEPHrine (XYLOCAINE W/EPI) 2  %-1:200000 (PF) injection 10 mL (has no administration in time range)  doxycycline (VIBRA-TABS) tablet 100 mg (100 mg Oral Given 11/06/19 0008)     Initial Impression / Assessment and Plan / ED Course  I have reviewed the triage vital signs and the nursing notes.  Pertinent labs & imaging results that were available during my care of the patient were reviewed by me and considered in my medical decision making (see chart for details).        Patient returning today due to worsening abscess of the right neck.  He states it is now so painful he cannot lay down.  Patient seen yesterday and given prescription for doxycycline which he  did not pick up.  I&D performed with pus drained.  Patient given a prescription of doxycycline to the [facility] which he requested and also mupirocin.  Patient was referred to dermatology.  Patient has had several Covid positive friends that he has been in contact with and wishes to be tested today.  Covid test sent.  Final Clinical Impressions(s) / ED Diagnoses   Final diagnoses:  Abscess of neck  Abscess of left knee    ED Discharge Orders         Ordered    doxycycline (VIBRAMYCIN) 100 MG capsule  2 times daily     11/05/19 2347    mupirocin nasal ointment (BACTROBAN) 2 %     11/05/19 2347           Blanchie Dessert, MD 11/06/19 0045

## 2019-11-07 ENCOUNTER — Telehealth: Payer: Medicaid Other | Admitting: Family

## 2019-11-07 NOTE — Progress Notes (Signed)
I am sorry, but I do not have access to remove your picture. Please contact your PCP office to do this.    Evelina Dun, FNP

## 2020-04-24 ENCOUNTER — Other Ambulatory Visit: Payer: Self-pay

## 2020-04-24 ENCOUNTER — Encounter (HOSPITAL_COMMUNITY): Payer: Self-pay

## 2020-04-24 ENCOUNTER — Emergency Department (HOSPITAL_COMMUNITY)
Admission: EM | Admit: 2020-04-24 | Discharge: 2020-04-24 | Disposition: A | Discharge status: Home / Self Care | Payer: Medicaid Other | Attending: Emergency Medicine | Admitting: Emergency Medicine

## 2020-04-24 DIAGNOSIS — F17210 Nicotine dependence, cigarettes, uncomplicated: Secondary | ICD-10-CM | POA: Insufficient documentation

## 2020-04-24 DIAGNOSIS — L0292 Furuncle, unspecified: Secondary | ICD-10-CM | POA: Insufficient documentation

## 2020-04-24 DIAGNOSIS — L089 Local infection of the skin and subcutaneous tissue, unspecified: Secondary | ICD-10-CM | POA: Diagnosis present

## 2020-04-24 HISTORY — DX: Methicillin resistant Staphylococcus aureus infection, unspecified site: A49.02

## 2020-04-24 MED ORDER — DOXYCYCLINE HYCLATE 100 MG PO CAPS: 100.0000 mg | ORAL_CAPSULE | Freq: Two times a day (BID) | ORAL | 0 refills | Status: AC

## 2020-04-24 MED ORDER — DOXYCYCLINE HYCLATE 100 MG PO TABS
100.0000 mg | ORAL_TABLET | Freq: Once | ORAL | Status: AC
  Administered 2020-04-24: 100 mg via ORAL
  Filled 2020-04-24: qty 1

## 2020-04-24 MED ORDER — MUPIROCIN 2 % EX OINT
TOPICAL_OINTMENT | Freq: Two times a day (BID) | CUTANEOUS | Status: DC
  Administered 2020-04-24: 1 via NASAL
  Filled 2020-04-24: qty 22

## 2020-04-24 NOTE — ED Triage Notes (Signed)
Patient states he has a history of MRSA and when he shaves he gets an infection on his face. patient does have raised areas where he has shaven.

## 2020-04-24 NOTE — Discharge Instructions (Signed)
You were given mupirocin nasal ointment here.  Use this twice a day around the nostrils.  You are prescribed doxycycline which is an antibiotic that covers MRSA.  Please follow-up with your dermatology appointment on Thursday for reevaluation.  Return to the ER for fever, worsening redness, swelling despite treatment.

## 2020-04-24 NOTE — ED Provider Notes (Signed)
[facility]   CSN: [id number] Arrival date & time: 04/24/20  1524     History Chief Complaint  Patient presents with  . Blood Infection    Manuel Gibbs is a 39 y.o. male presents to the ER for "MRSA infection".  Patient states skin around his chin and around his nose is red, irritated, red, warm.  Patient shaves the area and states when he gets an ingrown hair he tries to pick the hair out and becomes quickly infected.  Patient states he has been here multiple times for this and is prescribed antibiotics.  He states none of these worked because it keeps reoccurring.  Patient also states he does not have the money to afford the medicines his rheumatologist prescribed to him.  Patient went to [facility] dermatology in March 2021 but states he did not pick up any of the medicines he was prescribed because he could not afford it.  He denies associated fever.  HPI     Past Medical History:  Diagnosis Date  . MRSA (methicillin resistant Staphylococcus aureus)     There are no problems to display for this patient.   Past Surgical History:  Procedure Laterality Date  . abscess excision         Family History  Family history unknown: Yes    Social History   Tobacco Use  . Smoking status: Current Every Day Smoker    Packs/day: 0.35    Types: Cigarettes  . Smokeless tobacco: Never Used  Substance Use Topics  . Alcohol use: Yes    Alcohol/week: 2.0 standard drinks    Types: 2 Cans of beer per week    Comment: daily  . Drug use: Never    Home Medications Prior to Admission medications   Medication Sig Start Date End Date Taking? Authorizing Provider  doxycycline (VIBRAMYCIN) 100 MG capsule Take 1 capsule (100 mg total) by mouth 2 (two) times daily. 04/24/20   Liberty Handy, PA-C  famotidine (PEPCID) 20 MG tablet Take 1 tablet (20 mg total) by mouth 2 (two) times daily. Patient not taking: Reported on 11/05/2019 09/28/19    Devoria Albe, MD  ibuprofen (ADVIL) 200 MG tablet Take 200 mg by mouth every 6 (six) hours as needed for moderate pain.    [provider]  lidocaine (LIDODERM) 5 % Place 1 patch onto the skin daily. Remove & Discard patch within 12 hours or as directed by MD 09/14/19   Joy, Shawn C, PA-C  methocarbamol (ROBAXIN) 500 MG tablet Take 1 tablet (500 mg total) by mouth 2 (two) times daily. 09/14/19   Joy, Shawn C, PA-C  mupirocin nasal ointment (BACTROBAN) 2 % Apply in each nostril daily 11/05/19   Gwyneth Sprout, MD  predniSONE (DELTASONE) 20 MG tablet Take 3 po QD x 3d , then 2 po QD x 3d then 1 po QD x 3d Patient not taking: Reported on 11/05/2019 09/28/19   Devoria Albe, MD    Allergies    Patient has no known allergies.  Review of Systems   Review of Systems  Skin: Positive for color change.  All other systems reviewed and are negative.   Physical Exam Updated Vital Signs BP (!) 156/77 (BP Location: Right Arm)   Pulse 92   Temp 98.5 F (36.9 C) (Oral)   Resp 16   Ht 6' (1.829 m)   Wt 90.7 kg   SpO2 99%   BMI 27.12 kg/m   Physical Exam  Constitutional:      Appearance: He is well-developed.  HENT:     Head: Normocephalic.     Nose: Nose normal.  Eyes:     General: Lids are normal.  Cardiovascular:     Rate and Rhythm: Normal rate.  Pulmonary:     Effort: Pulmonary effort is normal.  Musculoskeletal:        General: Normal range of motion.     Cervical back: Normal range of motion.  Skin:    Comments: Skin around the chin and nostrils slightly erythematous, tender.  Honey colored scab underneath the inferior lip, tender.  No palpable abscess.  Neurological:     Mental Status: He is alert.  Psychiatric:        Behavior: Behavior normal.     ED Results / Procedures / Treatments   Labs (all labs ordered are listed, but only abnormal results are displayed) Labs Reviewed - No data to display  EKG None  Radiology No results found.  Procedures Procedures  (including critical care time)  Medications Ordered in ED Medications  mupirocin ointment (BACTROBAN) 2 % (has no administration in time range)    ED Course  I have reviewed the triage vital signs and the nursing notes.  Pertinent labs & imaging results that were available during my care of the patient were reviewed by me and considered in my medical decision making (see chart for details).    MDM Rules/Calculators/A&P                      39 year old male with recurrent skin infection around his nostrils and chin.  He has been seen in the ER for this several times.  I reviewed his chart to assist with MDM.  He saw [facility] dermatologist 2 months ago and dermatologist prescribed chlorhexidine solution, clindamycin solution and mupirocin ointment.  Patient states he did not pick up any of these prescriptions because he could not afford it.  Has been prescribed antibiotics from the ER several times for this.  On exam he has mild cellulitis around his chin and honey colored scabbing underneath his lip.  No signs of abscess.  No signs of severe cellulitis.  Afebrile.  Patient was given mupirocin ointment to take home from the ER to use around his nostrils.  Will prescribe doxycycline but explained to patient he needs to follow-up with dermatology, likely this is a recurrent process.  Return precautions discussed.  Patient is comfortable with plan. Final Clinical Impression(s) / ED Diagnoses Final diagnoses:  Furunculosis    Rx / DC Orders ED Discharge Orders         Ordered    doxycycline (VIBRAMYCIN) 100 MG capsule  2 times daily     04/24/20 1716           Kinnie Feil, PA-C 04/24/20 1716    Gareth Morgan, MD 04/25/20 1510

## 2020-05-03 ENCOUNTER — Other Ambulatory Visit: Payer: Self-pay

## 2020-05-03 ENCOUNTER — Emergency Department (HOSPITAL_COMMUNITY)
Admission: EM | Admit: 2020-05-03 | Discharge: 2020-05-03 | Disposition: A | Discharge status: Home / Self Care | Payer: Medicaid Other | Attending: Emergency Medicine | Admitting: Emergency Medicine

## 2020-05-03 ENCOUNTER — Encounter (HOSPITAL_COMMUNITY): Payer: Self-pay | Admitting: *Deleted

## 2020-05-03 DIAGNOSIS — R202 Paresthesia of skin: Secondary | ICD-10-CM | POA: Insufficient documentation

## 2020-05-03 DIAGNOSIS — B029 Zoster without complications: Secondary | ICD-10-CM | POA: Diagnosis not present

## 2020-05-03 DIAGNOSIS — F17210 Nicotine dependence, cigarettes, uncomplicated: Secondary | ICD-10-CM | POA: Insufficient documentation

## 2020-05-03 DIAGNOSIS — R21 Rash and other nonspecific skin eruption: Secondary | ICD-10-CM | POA: Diagnosis present

## 2020-05-03 MED ORDER — HYDROCODONE-ACETAMINOPHEN 5-325 MG PO TABS: 1.0000 | ORAL_TABLET | Freq: Four times a day (QID) | ORAL | 0 refills | Status: AC | PRN

## 2020-05-03 MED ORDER — LIDOCAINE 5 % EX PTCH
2.0000 | MEDICATED_PATCH | CUTANEOUS | Status: DC
  Administered 2020-05-03: 2 via TRANSDERMAL
  Filled 2020-05-03: qty 2

## 2020-05-03 MED ORDER — ACYCLOVIR 400 MG PO TABS: 800.0000 mg | ORAL_TABLET | Freq: Every day | ORAL | 0 refills | Status: AC

## 2020-05-03 MED FILL — HYDROCODON-APAP 5-325: 5-325 | 2 days supply | Qty: 8 | Fill #0

## 2020-05-03 MED FILL — ACYCLOVIR 400 MG TABLET: 400 | 7 days supply | Qty: 70 | Fill #0

## 2020-05-03 NOTE — ED Triage Notes (Signed)
Pt complains of painful rash along right torso for the past 4 days. Pt describes pain as burning. He is concerned he has shingles.

## 2020-05-03 NOTE — Discharge Instructions (Addendum)
Per our discussion, I am prescribing you 2 medications.  The first medication is called acyclovir. This is an antiviral medication. You are going to take 2 pills 5 times a day. You will do this for the next 7 days. Please complete this prescription.  I have also given you a short course of pain medications. You can continue to take ibuprofen but the Vicodin can be taken for breakthrough pain. Do not mix with alcohol. Only take it as prescribed.  If your symptoms worsen you can always return to the emergency department for reevaluation. You have been given follow-up information for [facility] wellness. Please follow-up with them.  It was a pleasure to meet you.

## 2020-05-03 NOTE — TOC Initial Note (Signed)
Transition of Care [facility]) - Initial/Assessment Note    Patient Details  Name: Manuel Gibbs MRN: [id number] Date of Birth: [birth year]  [facility] CM/SW Contact:    Elliot Cousin, RN Phone Number:  [phone] 05/03/2020, 2:30 PM  Clinical Narrative:                 TOC CM spoke to pt and states he currently living in a hotel. He is having a legal battle with his wife over custody of their son which has increased his anxiety and depression, and drug use. States he is wanting to get help and would like to speak to someone today. Updated ED provider. Pt states Medicaid covers his meds but he cannot afford copay. Explained he will need to discuss with pharmacy at retail pharmacy for a waive of copay cost. Pt plans to go to [facility] to pick up meds. Pt states she does not have PCP. Will schedule appt at [facility] and send information to pt. Provided pt with information on Outpatient Substance Abuse programs, housing, and Behavioral Health Outpt Clinics.   Appt arranged for May 17, 2020 at 1030 am. CM explained the importance of keeping appt or rescheduling if he is unable to keep appt time. Gave consent to [facility] referral for housing. Pt states he works part-time jobs here and there.     Barriers to Discharge: Homeless with medical needs   Patient Goals and CMS Choice Patient states their goals for this hospitalization and ongoing recovery are:: wants to get help      Expected Discharge Plan and Services                                                Prior Living Arrangements/Services                       Activities of Daily Living      Permission Sought/Granted                  Emotional Assessment              Admission diagnosis:  rt side rash There are no problems to display for this patient.  PCP:  Patient, No Pcp Per Pharmacy:   [facility] - [place], [place] - [address] [place] [address] Phone: [phone] Fax: [phone]  [facility] [phone] - [place], [place] - [address] [place] [address] Phone: [phone] Fax: [phone]  [id number] [place], [place] - [address] AT [address] [place] [address] Phone: [phone] Fax: [phone]     Social Determinants of Health (SDOH) Interventions    Readmission Risk Interventions No flowsheet data found.

## 2020-05-03 NOTE — ED Provider Notes (Signed)
[facility] DEPT Provider Note   CSN: [id number] Arrival date & time: 05/03/20  1212     History Chief Complaint  Patient presents with  . Rash    Manuel Gibbs is a 39 y.o. male.  HPI HPI Comments: Manuel Gibbs is a 39 y.o. male who presents to the Emergency Department complaining of rash.  Patient states that 4 days ago he began experiencing burning pain along the right ribs.  He then noticed a small painful vesicular rash that began spreading across his back, ribs, right stomach.  He reports associated tingling.  He states his pain is 10/10. He states it has continued to worsen today across his abdomen. 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  He states he has "no money".  He denies chest pain, shortness of breath, fevers.     Past Medical History:  Diagnosis Date  . MRSA (methicillin resistant Staphylococcus aureus)     There are no problems to display for this patient.   Past Surgical History:  Procedure Laterality Date  . abscess excision         Family History  Family history unknown: Yes    Social History   Tobacco Use  . Smoking status: Current Every Day Smoker    Packs/day: 0.35    Types: Cigarettes  . Smokeless tobacco: Never Used  Substance Use Topics  . Alcohol use: Yes    Alcohol/week: 2.0 standard drinks    Types: 2 Cans of beer per week    Comment: daily  . Drug use: Never    Home Medications Prior to Admission medications   Medication Sig Start Date End Date Taking? Authorizing Provider  doxycycline (VIBRAMYCIN) 100 MG capsule Take 1 capsule (100 mg total) by mouth 2 (two) times daily. 04/24/20   Kinnie Feil, PA-C  famotidine (PEPCID) 20 MG tablet Take 1 tablet (20 mg total) by mouth 2 (two) times daily. Patient not taking: Reported on 11/05/2019 09/28/19   Rolland Porter, MD  ibuprofen (ADVIL) 200 MG tablet Take 200 mg by mouth every 6 (six) hours as needed for moderate pain.     [provider]  lidocaine (LIDODERM) 5 % Place 1 patch onto the skin daily. Remove & Discard patch within 12 hours or as directed by MD 09/14/19   Joy, Shawn C, PA-C  methocarbamol (ROBAXIN) 500 MG tablet Take 1 tablet (500 mg total) by mouth 2 (two) times daily. 09/14/19   Joy, Shawn C, PA-C  mupirocin nasal ointment (BACTROBAN) 2 % Apply in each nostril daily 11/05/19   Blanchie Dessert, MD  predniSONE (DELTASONE) 20 MG tablet Take 3 po QD x 3d , then 2 po QD x 3d then 1 po QD x 3d Patient not taking: Reported on 11/05/2019 09/28/19   Rolland Porter, MD    Allergies    Patient has no known allergies.  Review of Systems   Review of Systems  Constitutional: Negative for chills and fever.  HENT: Negative for ear discharge and ear pain.   Eyes: Negative for photophobia, pain, discharge, redness, itching and visual disturbance.  Musculoskeletal: Positive for back pain and myalgias.  Skin: Positive for color change and rash.   Physical Exam Updated Vital Signs BP (!) 151/91 (BP Location: Right Arm)   Pulse 74   Temp 98.2 F (36.8 C) (Oral)   Resp 18   Ht 6' (1.829 m)   Wt 90.7 kg   SpO2 100%  BMI 27.12 kg/m   Physical Exam Vitals and nursing note reviewed.  Constitutional:      General: He is in acute distress.     Appearance: Normal appearance. He is normal weight. He is not ill-appearing, toxic-appearing or diaphoretic.  HENT:     Head: Normocephalic and atraumatic.     Nose: Nose normal.     Mouth/Throat:     Pharynx: Oropharynx is clear.  Eyes:     Extraocular Movements: Extraocular movements intact.  Cardiovascular:     Rate and Rhythm: Normal rate and regular rhythm.     Pulses: Normal pulses.     Heart sounds: Normal heart sounds. No murmur. No friction rub. No gallop.   Pulmonary:     Effort: Pulmonary effort is normal. No respiratory distress.     Breath sounds: Normal breath sounds. No stridor. No wheezing, rhonchi or rales.  Abdominal:     General:  Abdomen is flat.     Palpations: Abdomen is soft.     Tenderness: There is no abdominal tenderness.  Musculoskeletal:        General: Tenderness present. Normal range of motion.     Cervical back: Normal range of motion.  Skin:    General: Skin is warm and dry.     Findings: Erythema and rash present.     Comments: Painful vesicular erythematous rash noted to the right thoracic region wrapping around the right ribs and right abdomen.  Follows a clear dermatomal pattern.  Does not cross the midline.  Exquisite pain overlying the prior mentioned regions.  Neurological:     Mental Status: He is alert.    ED Results / Procedures / Treatments   Labs (all labs ordered are listed, but only abnormal results are displayed) Labs Reviewed - No data to display  EKG None  Radiology No results found.  Procedures Procedures (including critical care time)  Medications Ordered in ED Medications  lidocaine (LIDODERM) 5 % 2 patch (has no administration in time range)    ED Course  I have reviewed the triage vital signs and the nursing notes.  Pertinent labs & imaging results that were available during my care of the patient were reviewed by me and considered in my medical decision making (see chart for details).  Clinical Course as of May 03 1420  Wed May 03, 2020  1351 Patient presents with signs and symptoms consistent with herpes zoster.  Patient states he has no money to fill a prescription for acyclovir.  Will consult case management regarding this.  We discussed good Rx.  Will give patient Lidoderm patches for his pain.  He drove himself to the emergency department, so I cannot give narcotics at this time.  He took 600 mg of ibuprofen prior to arrival.   [LJ]  1351 Case management has spoken to the patient. They request that I send his prescriptions to the [facility] outpatient pharmacy. I will do so. He was also given referral to [facility] for primary care  follow-up.   [LJ]    Clinical Course User Index [LJ] Placido Sou, PA-C   MDM Rules/Calculators/A&P                      Patient is a 39 year old male who presents with herpes zoster.  He currently denies having any funds. I consulted case management. We will send his medications to the [facility] outpatient pharmacy. He was given follow-up with [facility] and wellness. He understands he can follow-up with them regarding the symptoms as well as to set up a primary care provider  We will discharge the patient on a short course of pain medication as well as acyclovir. He understands he can return to the emergency department with any new or worsening symptoms. His questions were answered and he was amicable at the time of discharge.  Patient discharged to home/self care.  Condition at discharge: Stable  Note: Portions of this report may have been transcribed using voice recognition software. Every effort was made to ensure accuracy; however, inadvertent computerized transcription errors may be present.    Final Clinical Impression(s) / ED Diagnoses Final diagnoses:  Herpes zoster without complication    Rx / DC Orders ED Discharge Orders         Ordered    acyclovir (ZOVIRAX) 400 MG tablet  5 times daily     05/03/20 1424    HYDROcodone-acetaminophen (NORCO/VICODIN) 5-325 MG tablet  Every 6 hours PRN     05/03/20 1424           Placido Sou, PA-C 05/03/20 1435    Tegeler, Canary Brim, MD 05/03/20 1749

## 2020-05-17 ENCOUNTER — Ambulatory Visit: Payer: Medicaid Other | Attending: Family Medicine

## 2020-05-17 ENCOUNTER — Other Ambulatory Visit: Payer: Self-pay

## 2021-02-13 IMAGING — CR DG KNEE 1-2V*L*
2 series · 2 of 2 positions shown · non-contrast
Comparison: None.

CLINICAL DATA: 38-year-old male with cellulitis, abscess.

EXAM:
LEFT KNEE - 1-2 VIEW

[x knee ap left (1 of 2)]
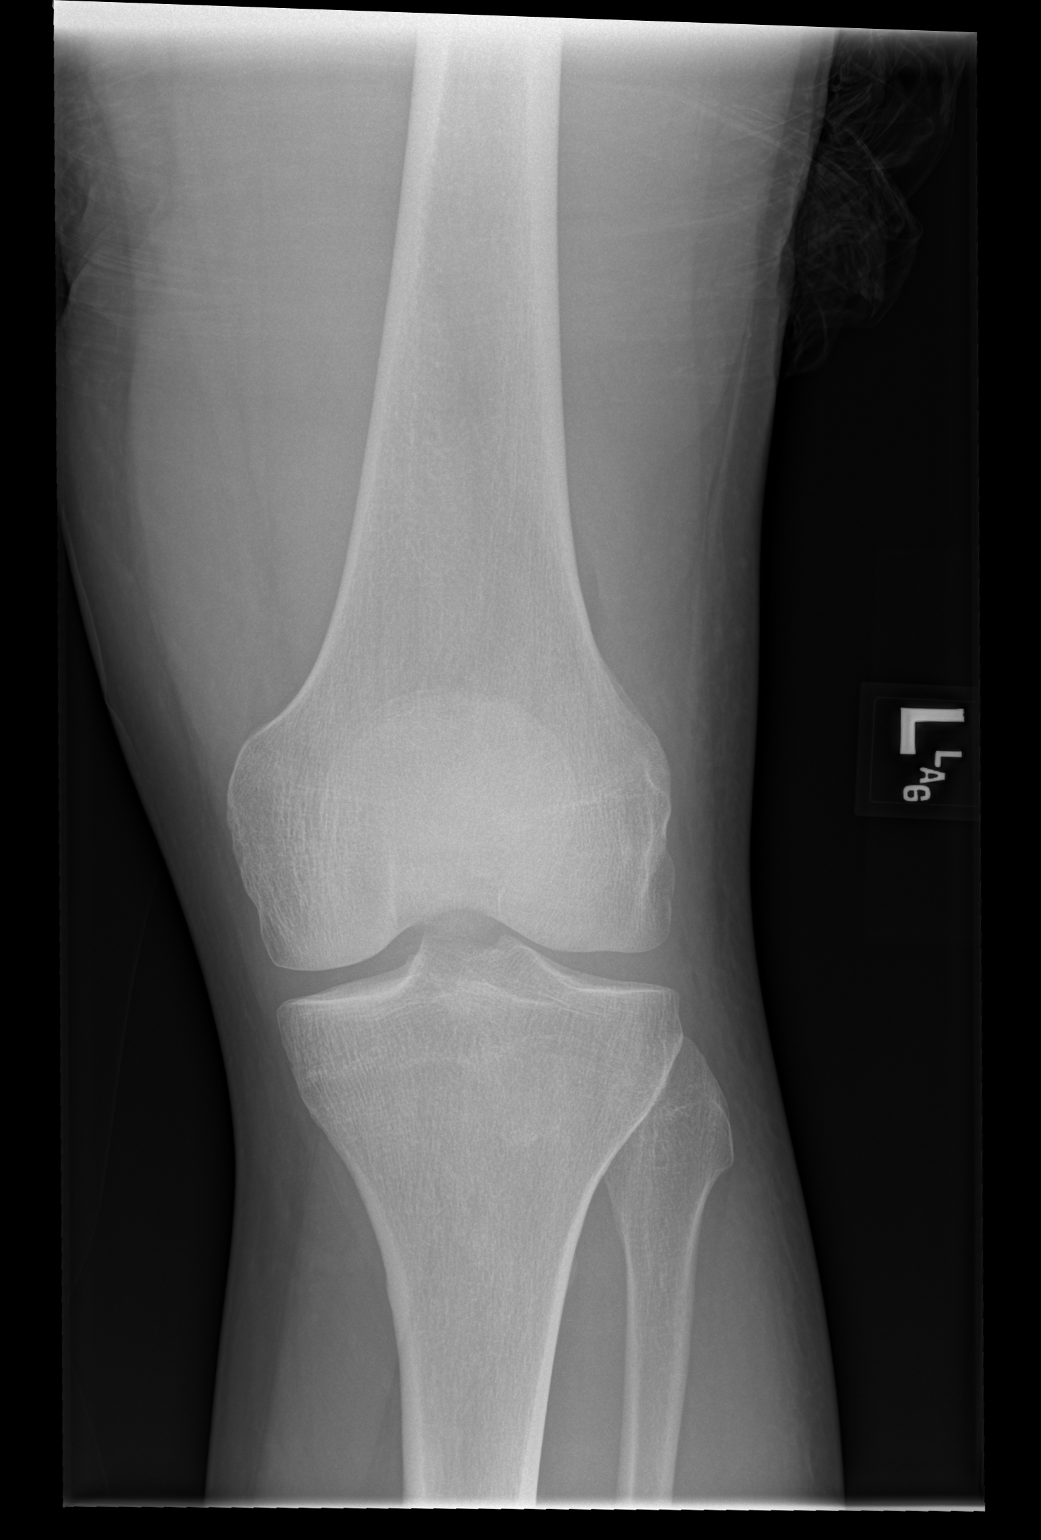

[x knee ap left (2 of 2)]
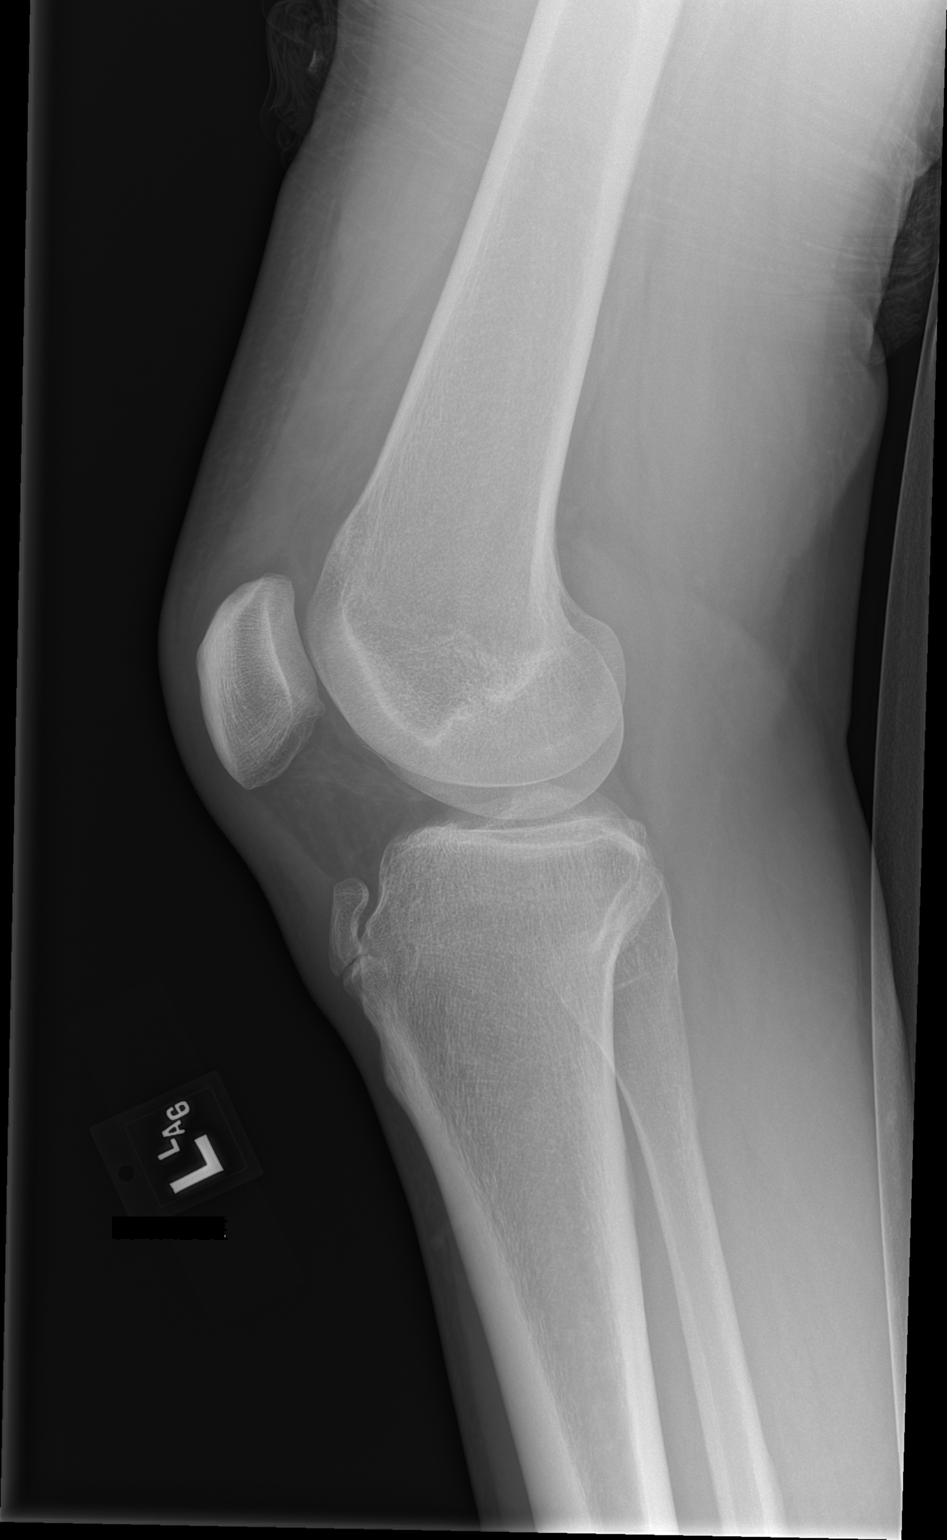

[2 of 2 positions shown; findings below may reference images not displayed]

FINDINGS: Bone mineralization is within normal limits. Joint spaces and
alignment preserved. No osteolysis identified. Chronic ossific
fragment at the tibial tuberosity.

Questionable small joint effusion. Anterior soft tissue swelling
mostly above the patella. No soft tissue gas. No radiopaque foreign
body identified.
IMPRESSION: Anterior soft tissue swelling with no soft tissue gas or
radiographic evidence of osteomyelitis. Questionable small joint
effusion.

## 2023-08-08 NOTE — Progress Notes (Unsigned)
This encounter was created in error - please disregard.
